# Patient Record
Sex: Male | Born: 1990 | Race: White | Hispanic: No | Marital: Married | Smoking: Never smoker
Health system: Southern US, Community
[De-identification: ages and names within clinical notes are randomized; demographics above are authoritative.]

## PROBLEM LIST (undated history)

## (undated) DIAGNOSIS — Z9109 Other allergy status, other than to drugs and biological substances: Secondary | ICD-10-CM

## (undated) HISTORY — PX: SHOULDER SURGERY: SHX246

---
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
## 2009-01-15 ENCOUNTER — Ambulatory Visit (HOSPITAL_COMMUNITY): Admission: RE | Admit: 2009-01-15 | Discharge: 2009-01-15 | Payer: Self-pay | Admitting: Internal Medicine

## 2009-01-21 ENCOUNTER — Ambulatory Visit (HOSPITAL_COMMUNITY): Admission: RE | Admit: 2009-01-21 | Discharge: 2009-01-21 | Payer: Self-pay | Admitting: Internal Medicine

## 2009-03-24 ENCOUNTER — Encounter: Admission: RE | Admit: 2009-03-24 | Discharge: 2009-05-05 | Payer: Self-pay | Admitting: Orthopedic Surgery

## 2009-05-24 ENCOUNTER — Encounter: Admission: RE | Admit: 2009-05-24 | Discharge: 2009-07-02 | Payer: Self-pay | Admitting: Orthopedic Surgery
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
## 2012-07-14 ENCOUNTER — Emergency Department (HOSPITAL_COMMUNITY): Payer: Self-pay

## 2012-07-14 ENCOUNTER — Encounter (HOSPITAL_COMMUNITY): Payer: Self-pay | Admitting: Radiology

## 2012-07-14 ENCOUNTER — Observation Stay (HOSPITAL_COMMUNITY)
Admission: EM | Admit: 2012-07-14 | Discharge: 2012-07-15 | Disposition: A | Discharge status: Home / Self Care | Payer: Self-pay | Attending: General Surgery | Admitting: General Surgery

## 2012-07-14 DIAGNOSIS — S060X0A Concussion without loss of consciousness, initial encounter: Principal | ICD-10-CM | POA: Insufficient documentation

## 2012-07-14 DIAGNOSIS — S060XAA Concussion with loss of consciousness status unknown, initial encounter: Secondary | ICD-10-CM | POA: Diagnosis present

## 2012-07-14 DIAGNOSIS — R4182 Altered mental status, unspecified: Secondary | ICD-10-CM | POA: Insufficient documentation

## 2012-07-14 DIAGNOSIS — IMO0002 Reserved for concepts with insufficient information to code with codable children: Secondary | ICD-10-CM | POA: Insufficient documentation

## 2012-07-14 DIAGNOSIS — R51 Headache: Secondary | ICD-10-CM | POA: Insufficient documentation

## 2012-07-14 DIAGNOSIS — S060X9A Concussion with loss of consciousness of unspecified duration, initial encounter: Secondary | ICD-10-CM | POA: Diagnosis present

## 2012-07-14 DIAGNOSIS — S0990XA Unspecified injury of head, initial encounter: Secondary | ICD-10-CM

## 2012-07-14 HISTORY — DX: Other allergy status, other than to drugs and biological substances: Z91.09

## 2012-07-14 LAB — CBC WITH DIFFERENTIAL/PLATELET
Basophils Relative: 1 % (ref 0–1)
HCT: 46.5 % (ref 39.0–52.0)
Hemoglobin: 16.4 g/dL (ref 13.0–17.0)
Lymphocytes Relative: 28 % (ref 12–46)
Lymphs Abs: 3.1 10*3/uL (ref 0.7–4.0)
MCHC: 35.3 g/dL (ref 30.0–36.0)
Monocytes Absolute: 1 10*3/uL (ref 0.1–1.0)
Monocytes Relative: 9 % (ref 3–12)
Neutro Abs: 6.4 10*3/uL (ref 1.7–7.7)
RBC: 5.03 MIL/uL (ref 4.22–5.81)

## 2012-07-14 LAB — LACTIC ACID, PLASMA: Lactic Acid, Venous: 1.5 mmol/L (ref 0.5–2.2)

## 2012-07-14 MED ORDER — IOHEXOL 300 MG/ML  SOLN
100.0000 mL | Freq: Once | INTRAMUSCULAR | Status: AC | PRN
  Administered 2012-07-14: 100 mL via INTRAVENOUS

## 2012-07-14 MED ORDER — SODIUM CHLORIDE 0.9 % IV BOLUS (SEPSIS)
1000.0000 mL | Freq: Once | INTRAVENOUS | Status: AC
  Administered 2012-07-14: 1000 mL via INTRAVENOUS

## 2012-07-14 NOTE — ED Notes (Signed)
Returns from CT, calm, NAD, interactive, no changes, chaplain to [facility], pt informed, "mother on the way".

## 2012-07-14 NOTE — ED Notes (Signed)
MAEx4, follows commands, generally weak, poor effort, equal x4 extremeties. CMS intact. PERRL 3mm, brisk. c-collar remains. VSS. IVF infusing.
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
## 2012-07-14 NOTE — ED Notes (Signed)
To CT now 

## 2012-07-14 NOTE — ED Notes (Signed)
FAST U/S at BS

## 2012-07-14 NOTE — ED Notes (Signed)
Xray finished at [facility], lab at [facility]. C/o HA remains.
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
## 2012-07-15 ENCOUNTER — Observation Stay (HOSPITAL_COMMUNITY): Payer: Self-pay

## 2012-07-15 ENCOUNTER — Encounter (HOSPITAL_COMMUNITY): Payer: Self-pay | Admitting: *Deleted

## 2012-07-15 DIAGNOSIS — S060X9A Concussion with loss of consciousness of unspecified duration, initial encounter: Secondary | ICD-10-CM

## 2012-07-15 DIAGNOSIS — S060XAA Concussion with loss of consciousness status unknown, initial encounter: Secondary | ICD-10-CM | POA: Diagnosis present

## 2012-07-15 LAB — URINALYSIS, MICROSCOPIC ONLY
Glucose, UA: NEGATIVE mg/dL
Hgb urine dipstick: NEGATIVE
Leukocytes, UA: NEGATIVE
Specific Gravity, Urine: 1.04 — ABNORMAL HIGH (ref 1.005–1.030)
Urobilinogen, UA: 0.2 mg/dL (ref 0.0–1.0)

## 2012-07-15 LAB — COMPREHENSIVE METABOLIC PANEL
Alkaline Phosphatase: 75 U/L (ref 39–117)
BUN: 16 mg/dL (ref 6–23)
CO2: 26 mEq/L (ref 19–32)
Chloride: 106 mEq/L (ref 96–112)
Creatinine, Ser: 0.96 mg/dL (ref 0.50–1.35)
GFR calc Af Amer: >90 mL/min (ref >90)
GFR calc non Af Amer: >90 mL/min (ref >90)
Glucose, Bld: 100 mg/dL — ABNORMAL HIGH (ref 70–99)
Total Bilirubin: 0.4 mg/dL (ref 0.3–1.2)

## 2012-07-15 LAB — RAPID URINE DRUG SCREEN, HOSP PERFORMED: Opiates: NOT DETECTED

## 2012-07-15 LAB — ETHANOL: Alcohol, Ethyl (B): <11 mg/dL (ref 0–11)

## 2012-07-15 MED ORDER — OXYCODONE HCL 5 MG PO TABS
5.0000 mg | ORAL_TABLET | ORAL | Status: DC | PRN
  Administered 2012-07-15 (×2): 5 mg via ORAL
  Filled 2012-07-15 (×2): qty 1

## 2012-07-15 MED ORDER — PANTOPRAZOLE SODIUM 40 MG IV SOLR
40.0000 mg | Freq: Every day | INTRAVENOUS | Status: DC
  Administered 2012-07-15: 40 mg via INTRAVENOUS
  Filled 2012-07-15: qty 40

## 2012-07-15 MED ORDER — OXYCODONE HCL 5 MG PO TABS: 2.5000 mg | ORAL_TABLET | ORAL | Status: DC | PRN

## 2012-07-15 MED ORDER — ONDANSETRON HCL 4 MG/2ML IJ SOLN: 4.0000 mg | Freq: Four times a day (QID) | INTRAMUSCULAR | Status: DC | PRN

## 2012-07-15 MED ORDER — OXYCODONE HCL 5 MG PO TABS: 10.0000 mg | ORAL_TABLET | ORAL | Status: DC | PRN

## 2012-07-15 MED ORDER — MORPHINE SULFATE 2 MG/ML IJ SOLN: 2.0000 mg | INTRAMUSCULAR | Status: DC | PRN

## 2012-07-15 MED ORDER — PANTOPRAZOLE SODIUM 40 MG PO TBEC: 40.0000 mg | DELAYED_RELEASE_TABLET | Freq: Every day | ORAL | Status: DC

## 2012-07-15 MED ORDER — KCL IN DEXTROSE-NACL 20-5-0.45 MEQ/L-%-% IV SOLN
INTRAVENOUS | Status: DC
  Administered 2012-07-15: 04:00:00 via INTRAVENOUS
  Filled 2012-07-15 (×3): qty 1000

## 2012-07-15 MED ORDER — OXYCODONE-ACETAMINOPHEN 5-325 MG PO TABS: 1.0000 | ORAL_TABLET | ORAL | Status: DC | PRN

## 2012-07-15 MED ORDER — MORPHINE SULFATE 4 MG/ML IJ SOLN
4.0000 mg | INTRAMUSCULAR | Status: DC | PRN
  Administered 2012-07-15: 4 mg via INTRAVENOUS
  Filled 2012-07-15: qty 1

## 2012-07-15 MED ORDER — ONDANSETRON HCL 4 MG PO TABS: 4.0000 mg | ORAL_TABLET | Freq: Four times a day (QID) | ORAL | Status: DC | PRN

## 2012-07-15 NOTE — ED Notes (Signed)
Parents x2 into BS, EDP Dr. Jean Rosenthal at [facility] speaking with family, chaplain present. No changes with pt, foley placed, no urine present at this time. VSS.

## 2012-07-15 NOTE — ED Notes (Signed)
Gave pts belongings bag with clothes in it, shoes, and personal items to parents.
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
## 2012-07-15 NOTE — Discharge Summary (Signed)
Okay to go home.  This patient has been seen and I agree with the findings and treatment plan.  Andrea Colglazier O. Angelyne Terwilliger, III, MD, FACS [phone] (pager) [phone] (direct pager) Trauma Surgeon 

## 2012-07-15 NOTE — ED Notes (Signed)
[facility] at [facility], Trauma surgeon at [facility].

## 2012-07-15 NOTE — ED Notes (Signed)
CMS intact. MAEx4. PERRL 5mm brisk. Alert, interactive, calm, skin W&D, resps e/u, speaking in clear statements. Answering questions appropriately. Family at [facility] x3. Foley draining to gravity.
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
## 2012-07-15 NOTE — Progress Notes (Signed)
After pt was stabilized, I asked if he wanted me to call family. He asked that I call his mom, whose name and number were listed as contact info. After pt's mom and step-father arrived, I continued to provide pastoral care to pt and family until after the dr gave updates.  Marjory Lies Chaplain

## 2012-07-15 NOTE — Progress Notes (Signed)
UR completed 

## 2012-07-15 NOTE — Progress Notes (Signed)
Flex/ext. C-spine looks okay.  Still has some neck soreness.  Will dc collar, may use soft collar if soreness a problem.  This patient has been seen and I agree with the findings and treatment plan.  Marta Lamas. Gae Bon, MD, FACS [phone] (pager) [phone] (direct pager) Trauma Surgeon

## 2012-07-24 NOTE — ED Provider Notes (Signed)
I saw and evaluated the patient, reviewed the resident's note and I agree with the findings and plan.  Please see my completed note for this encounter.  Raeford Razor, MD 07/24/12 [phone]

## 2013-08-28 ENCOUNTER — Other Ambulatory Visit (HOSPITAL_COMMUNITY): Payer: Self-pay | Admitting: Physician Assistant

## 2013-08-28 DIAGNOSIS — R51 Headache: Secondary | ICD-10-CM

## 2013-08-29 ENCOUNTER — Ambulatory Visit (HOSPITAL_COMMUNITY): Payer: BC Managed Care – PPO

## 2014-03-11 ENCOUNTER — Institutional Professional Consult (permissible substitution): Payer: BC Managed Care – PPO | Admitting: Internal Medicine

## 2014-04-10 ENCOUNTER — Institutional Professional Consult (permissible substitution): Payer: BC Managed Care – PPO | Admitting: Internal Medicine

## 2014-05-04 ENCOUNTER — Institutional Professional Consult (permissible substitution): Payer: BC Managed Care – PPO | Admitting: Internal Medicine

## 2014-06-05 ENCOUNTER — Institutional Professional Consult (permissible substitution): Payer: BC Managed Care – PPO | Admitting: Internal Medicine

## 2014-07-08 ENCOUNTER — Institutional Professional Consult (permissible substitution): Payer: BC Managed Care – PPO | Admitting: Internal Medicine

## 2020-07-30 ENCOUNTER — Other Ambulatory Visit (HOSPITAL_COMMUNITY): Payer: Self-pay | Admitting: Internal Medicine

## 2020-08-02 ENCOUNTER — Other Ambulatory Visit: Payer: Self-pay | Admitting: Internal Medicine

## 2020-08-02 ENCOUNTER — Other Ambulatory Visit (HOSPITAL_COMMUNITY): Payer: Self-pay | Admitting: Physician Assistant

## 2020-08-02 DIAGNOSIS — J329 Chronic sinusitis, unspecified: Secondary | ICD-10-CM

## 2020-08-17 ENCOUNTER — Other Ambulatory Visit: Payer: Self-pay

## 2020-08-17 ENCOUNTER — Ambulatory Visit (HOSPITAL_COMMUNITY)
Admission: RE | Admit: 2020-08-17 | Discharge: 2020-08-17 | Disposition: A | Discharge status: Home / Self Care | Payer: BC Managed Care – PPO | Source: Ambulatory Visit | Attending: Physician Assistant | Admitting: Physician Assistant

## 2020-08-17 DIAGNOSIS — J329 Chronic sinusitis, unspecified: Secondary | ICD-10-CM | POA: Insufficient documentation

## 2022-08-31 ENCOUNTER — Telehealth: Payer: BC Managed Care – PPO | Admitting: Physician Assistant

## 2022-08-31 DIAGNOSIS — J329 Chronic sinusitis, unspecified: Secondary | ICD-10-CM

## 2022-08-31 DIAGNOSIS — J40 Bronchitis, not specified as acute or chronic: Secondary | ICD-10-CM

## 2022-08-31 MED ORDER — PREDNISONE 20 MG PO TABS: 40.0000 mg | ORAL_TABLET | Freq: Every day | ORAL | 0 refills | Status: AC

## 2022-08-31 MED ORDER — BENZONATATE 100 MG PO CAPS: 100.0000 mg | ORAL_CAPSULE | Freq: Three times a day (TID) | ORAL | 0 refills | Status: DC | PRN

## 2022-08-31 MED ORDER — AMOXICILLIN-POT CLAVULANATE 875-125 MG PO TABS: 1.0000 | ORAL_TABLET | Freq: Two times a day (BID) | ORAL | 0 refills | Status: AC

## 2022-08-31 MED ORDER — PROMETHAZINE-DM 6.25-15 MG/5ML PO SYRP: 5.0000 mL | ORAL_SOLUTION | Freq: Four times a day (QID) | ORAL | 0 refills | Status: AC | PRN
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
## 2022-08-31 NOTE — Patient Instructions (Signed)
Terry Rivera, thank you for joining Margaretann Loveless, PA-C for today's virtual visit.  While this provider is not your primary care provider (PCP), if your PCP is located in our provider database this encounter information will be shared with them immediately following your visit.   A [facility] MyChart account gives you access to today's visit and all your visits, tests, and labs performed at [facility] " click here if you don't have a [facility] MyChart account or go to [email or web address]  Consent: (Patient) Terry Rivera provided verbal consent for this virtual visit at the beginning of the encounter.  Current Medications:  Current Outpatient Medications:    amoxicillin-clavulanate (AUGMENTIN) 875-125 MG tablet, Take 1 tablet by mouth 2 (two) times daily., Disp: 14 tablet, Rfl: 0   benzonatate (TESSALON) 100 MG capsule, Take 1 capsule (100 mg total) by mouth 3 (three) times daily as needed., Disp: 30 capsule, Rfl: 0   predniSONE (DELTASONE) 20 MG tablet, Take 2 tablets (40 mg total) by mouth daily with breakfast., Disp: 10 tablet, Rfl: 0   promethazine-dextromethorphan (PROMETHAZINE-DM) 6.25-15 MG/5ML syrup, Take 5 mLs by mouth 4 (four) times daily as needed., Disp: 118 mL, Rfl: 0   Medications ordered in this encounter:  Meds ordered this encounter  Medications   amoxicillin-clavulanate (AUGMENTIN) 875-125 MG tablet    Sig: Take 1 tablet by mouth 2 (two) times daily.    Dispense:  14 tablet    Refill:  0    Order Specific Question:   Supervising Provider    Answer:   Merrilee Jansky [[id number]]   benzonatate (TESSALON) 100 MG capsule    Sig: Take 1 capsule (100 mg total) by mouth 3 (three) times daily as needed.    Dispense:  30 capsule    Refill:  0    Order Specific Question:   Supervising Provider    Answer:   Merrilee Jansky [id number]   promethazine-dextromethorphan (PROMETHAZINE-DM) 6.25-15 MG/5ML syrup    Sig: Take 5 mLs by mouth 4 (four)  times daily as needed.    Dispense:  118 mL    Refill:  0    Order Specific Question:   Supervising Provider    Answer:   Merrilee Jansky [[id number]]   predniSONE (DELTASONE) 20 MG tablet    Sig: Take 2 tablets (40 mg total) by mouth daily with breakfast.    Dispense:  10 tablet    Refill:  0    Order Specific Question:   Supervising Provider    Answer:   Merrilee Jansky [[id number]]     *If you need refills on other medications prior to your next appointment, please contact your pharmacy*  Follow-Up: Call back or seek an in-person evaluation if the symptoms worsen or if the condition fails to improve as anticipated.  [facility] [phone]  Other Instructions  Acute Bronchitis, Adult  Acute bronchitis is when air tubes in the lungs (bronchi) suddenly get swollen. The condition can make it hard for you to breathe. In adults, acute bronchitis usually goes away within 2 weeks. A cough caused by bronchitis may last up to 3 weeks. Smoking, allergies, and asthma can make the condition worse. What are the causes? Germs that cause cold and flu (viruses). The most common cause of this condition is the virus that causes the common cold. Bacteria. Substances that bother (irritate) the lungs, including: Smoke from cigarettes and other types of tobacco. Dust and pollen. Fumes from  chemicals, gases, or burned fuel. Indoor or outdoor air pollution. What increases the risk? A weak body's defense system. This is also called the immune system. Any condition that affects your lungs and breathing, such as asthma. What are the signs or symptoms? A cough. Coughing up clear, yellow, or green mucus. Making high-pitched whistling sounds when you breathe, most often when you breathe out (wheezing). Runny or stuffy nose. Having too much mucus in your lungs (chest congestion). Shortness of breath. Body aches. A sore throat. How is this treated? Acute bronchitis may go away over  time without treatment. Your doctor may tell you to: Drink more fluids. This will help thin your mucus so it is easier to cough up. Use a device that gets medicine into your lungs (inhaler). Use a vaporizer or a humidifier. These are machines that add water to the air. This helps with coughing and poor breathing. Take a medicine that thins mucus and helps clear it from your lungs. Take a medicine that prevents or stops coughing. It is not common to take an antibiotic medicine for this condition. Follow these instructions at home:  Take over-the-counter and prescription medicines only as told by your doctor. Use an inhaler, vaporizer, or humidifier as told by your doctor. Take two teaspoons (10 mL) of honey at bedtime. This helps lessen your coughing at night. Drink enough fluid to keep your pee (urine) pale yellow. Do not smoke or use any products that contain nicotine or tobacco. If you need help quitting, ask your doctor. Get a lot of rest. Return to your normal activities when your doctor says that it is safe. Keep all follow-up visits. How is this prevented?  Wash your hands often with soap and water for at least 20 seconds. If you cannot use soap and water, use hand sanitizer. Avoid contact with people who have cold symptoms. Try not to touch your mouth, nose, or eyes with your hands. Avoid breathing in smoke or chemical fumes. Make sure to get the flu shot every year. Contact a doctor if: Your symptoms do not get better in 2 weeks. You have trouble coughing up the mucus. Your cough keeps you awake at night. You have a fever. Get help right away if: You cough up blood. You have chest pain. You have very bad shortness of breath. You faint or keep feeling like you are going to faint. You have a very bad headache. Your fever or chills get worse. These symptoms may be an emergency. Get help right away. Call your local emergency services (911 in the [place] Do not wait to see if  the symptoms will go away. Do not drive yourself to the hospital. Summary Acute bronchitis is when air tubes in the lungs (bronchi) suddenly get swollen. In adults, acute bronchitis usually goes away within 2 weeks. Drink more fluids. This will help thin your mucus so it is easier to cough up. Take over-the-counter and prescription medicines only as told by your doctor. Contact a doctor if your symptoms do not improve after 2 weeks of treatment. This information is not intended to replace advice given to you by your health care provider. Make sure you discuss any questions you have with your health care provider. Document Revised: 12/29/2020 Document Reviewed: 12/29/2020 Elsevier Patient Education  2023 Elsevier Inc.   Sinus Infection, Adult A sinus infection is soreness and swelling (inflammation) of your sinuses. Sinuses are hollow spaces in the bones around your face. They are located: Around your eyes. In  the middle of your forehead. Behind your nose. In your cheekbones. Your sinuses and nasal passages are lined with a fluid called mucus. Mucus drains out of your sinuses. Swelling can trap mucus in your sinuses. This lets germs (bacteria, virus, or fungus) grow, which leads to infection. Most of the time, this condition is caused by a virus. What are the causes? Allergies. Asthma. Germs. Things that block your nose or sinuses. Growths in the nose (nasal polyps). Chemicals or irritants in the air. A fungus. This is rare. What increases the risk? Having a weak body defense system (immune system). Doing a lot of swimming or diving. Using nasal sprays too much. Smoking. What are the signs or symptoms? The main symptoms of this condition are pain and a feeling of pressure around the sinuses. Other symptoms include: Stuffy nose (congestion). This may make it hard to breathe through your nose. Runny nose (drainage). Soreness, swelling, and warmth in the sinuses. A cough that may  get worse at night. Being unable to smell and taste. Mucus that collects in the throat or the back of the nose (postnasal drip). This may cause a sore throat or bad breath. Being very tired (fatigued). A fever. How is this diagnosed? Your symptoms. Your medical history. A physical exam. Tests to find out if your condition is short-term (acute) or long-term (chronic). Your doctor may: Check your nose for growths (polyps). Check your sinuses using a tool that has a light on one end (endoscope). Check for allergies or germs. Do imaging tests, such as an MRI or CT scan. How is this treated? Treatment for this condition depends on the cause and whether it is short-term or long-term. If caused by a virus, your symptoms should go away on their own within 10 days. You may be given medicines to relieve symptoms. They include: Medicines that shrink swollen tissue in the nose. A spray that treats swelling of the nostrils. Rinses that help get rid of thick mucus in your nose (nasal saline washes). Medicines that treat allergies (antihistamines). Over-the-counter pain relievers. If caused by bacteria, your doctor may wait to see if you will get better without treatment. You may be given antibiotic medicine if you have: A very bad infection. A weak body defense system. If caused by growths in the nose, surgery may be needed. Follow these instructions at home: Medicines Take, use, or apply over-the-counter and prescription medicines only as told by your doctor. These may include nasal sprays. If you were prescribed an antibiotic medicine, take it as told by your doctor. Do not stop taking it even if you start to feel better. Hydrate and humidify  Drink enough water to keep your pee (urine) pale yellow. Use a cool mist humidifier to keep the humidity level in your home above 50%. Breathe in steam for 10-15 minutes, 3-4 times a day, or as told by your doctor. You can do this in the bathroom while a  hot shower is running. Try not to spend time in cool or dry air. Rest Rest as much as you can. Sleep with your head raised (elevated). Make sure you get enough sleep each night. General instructions  Put a warm, moist washcloth on your face 3-4 times a day, or as often as told by your doctor. Use nasal saline washes as often as told by your doctor. Wash your hands often with soap and water. If you cannot use soap and water, use hand sanitizer. Do not smoke. Avoid being around people who  are smoking (secondhand smoke). Keep all follow-up visits. Contact a doctor if: You have a fever. Your symptoms get worse. Your symptoms do not get better within 10 days. Get help right away if: You have a very bad headache. You cannot stop vomiting. You have very bad pain or swelling around your face or eyes. You have trouble seeing. You feel confused. Your neck is stiff. You have trouble breathing. These symptoms may be an emergency. Get help right away. Call 911. Do not wait to see if the symptoms will go away. Do not drive yourself to the hospital. Summary A sinus infection is swelling of your sinuses. Sinuses are hollow spaces in the bones around your face. This condition is caused by tissues in your nose that become inflamed or swollen. This traps germs. These can lead to infection. If you were prescribed an antibiotic medicine, take it as told by your doctor. Do not stop taking it even if you start to feel better. Keep all follow-up visits. This information is not intended to replace advice given to you by your health care provider. Make sure you discuss any questions you have with your health care provider. Document Revised: 08/02/2021 Document Reviewed: 08/02/2021 Elsevier Patient Education  2023 Elsevier Inc.    If you have been instructed to have an in-person evaluation today at a local Urgent Care facility, please use the link below. It will take you to a list of all of our  available [facility], including address, phone number and hours of operation. Please do not delay care.  [facility]  If you or a family member do not have a primary care provider, use the link below to schedule a visit and establish care. When you choose a [facility] primary care physician or advanced practice provider, you gain a long-term partner in health. Find a Primary Care Provider  Learn more about [facility] in-office and virtual care options:  - Get Care Now

## 2023-07-07 ENCOUNTER — Ambulatory Visit
Admission: EM | Admit: 2023-07-07 | Discharge: 2023-07-07 | Disposition: A | Discharge status: Home / Self Care | Payer: BC Managed Care – PPO | Attending: Internal Medicine | Admitting: Internal Medicine

## 2023-07-07 DIAGNOSIS — Z1152 Encounter for screening for COVID-19: Secondary | ICD-10-CM | POA: Diagnosis not present

## 2023-07-07 DIAGNOSIS — J069 Acute upper respiratory infection, unspecified: Secondary | ICD-10-CM | POA: Insufficient documentation

## 2023-07-07 MED ORDER — BENZONATATE 100 MG PO CAPS: 100.0000 mg | ORAL_CAPSULE | Freq: Three times a day (TID) | ORAL | 0 refills | Status: AC

## 2023-07-07 NOTE — ED Triage Notes (Signed)
Pt reports he has a cough, cold sweat and body aches x 3 days    Took mucinex

## 2023-07-07 NOTE — Discharge Instructions (Signed)
You have a viral illness which will improve on its own with rest, fluids, and medications to help with your symptoms. We discussed prescriptions that may help with your symptoms: tessalon perles as needed for cough You may use over the counter medicines as needed: tylenol/motrin, mucinex, zyrtec, Flonase Two teaspoons of honey in 1 cup of warm water every 4-6 hours may help with throat pains. Humidifier in room at nighttime may help soothe cough (clean well daily).   For chest pain, shortness of breath, inability to keep food or fluids down without vomiting, fever that does not respond to tylenol or motrin, or any other severe symptoms, please go to the ER for further evaluation. Return to urgent care as needed, otherwise follow-up with PCP.
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
## 2023-07-08 LAB — SARS CORONAVIRUS 2 (TAT 6-24 HRS): SARS Coronavirus 2: NEGATIVE

## 2024-03-28 ENCOUNTER — Encounter: Payer: Self-pay | Admitting: Advanced Practice Midwife
